# Patient Record
Sex: Male | Born: 1985
Health system: Southern US, Community
[De-identification: ages and names within clinical notes are randomized; demographics above are authoritative.]

## PROBLEM LIST (undated history)

## (undated) DIAGNOSIS — J45909 Unspecified asthma, uncomplicated: Secondary | ICD-10-CM

## (undated) HISTORY — DX: Unspecified asthma, uncomplicated: J45.909

## (undated) HISTORY — PX: EYE SURGERY: SHX253

---
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
## 2004-04-17 ENCOUNTER — Encounter: Admission: RE | Admit: 2004-04-17 | Discharge: 2004-04-17 | Payer: Self-pay | Admitting: Family Medicine

## 2019-06-30 DIAGNOSIS — L309 Dermatitis, unspecified: Secondary | ICD-10-CM | POA: Diagnosis not present

## 2019-07-25 DIAGNOSIS — H44522 Atrophy of globe, left eye: Secondary | ICD-10-CM | POA: Diagnosis not present

## 2019-07-25 DIAGNOSIS — H5211 Myopia, right eye: Secondary | ICD-10-CM | POA: Diagnosis not present

## 2019-08-01 DIAGNOSIS — M792 Neuralgia and neuritis, unspecified: Secondary | ICD-10-CM | POA: Diagnosis not present

## 2019-08-01 DIAGNOSIS — Z970 Presence of artificial eye: Secondary | ICD-10-CM | POA: Diagnosis not present

## 2019-08-01 DIAGNOSIS — K1120 Sialoadenitis, unspecified: Secondary | ICD-10-CM | POA: Diagnosis not present

## 2019-08-03 DIAGNOSIS — N4889 Other specified disorders of penis: Secondary | ICD-10-CM | POA: Diagnosis not present

## 2019-08-03 DIAGNOSIS — N50811 Right testicular pain: Secondary | ICD-10-CM | POA: Diagnosis not present

## 2019-08-03 DIAGNOSIS — R102 Pelvic and perineal pain: Secondary | ICD-10-CM | POA: Diagnosis not present

## 2019-08-03 DIAGNOSIS — N442 Benign cyst of testis: Secondary | ICD-10-CM | POA: Diagnosis not present

## 2019-08-22 DIAGNOSIS — Z0000 Encounter for general adult medical examination without abnormal findings: Secondary | ICD-10-CM | POA: Diagnosis not present

## 2019-08-22 DIAGNOSIS — Z13220 Encounter for screening for lipoid disorders: Secondary | ICD-10-CM | POA: Diagnosis not present

## 2019-08-22 LAB — TSH: TSH: 1.98 (ref 0.41–5.90)

## 2019-08-22 LAB — LIPID PANEL
HDL: 48 (ref 35–70)
LDL Cholesterol: 88
Triglycerides: 41 (ref 40–160)

## 2019-08-29 DIAGNOSIS — K1120 Sialoadenitis, unspecified: Secondary | ICD-10-CM | POA: Diagnosis not present

## 2019-08-29 DIAGNOSIS — M792 Neuralgia and neuritis, unspecified: Secondary | ICD-10-CM | POA: Diagnosis not present

## 2019-08-29 DIAGNOSIS — Z0000 Encounter for general adult medical examination without abnormal findings: Secondary | ICD-10-CM | POA: Diagnosis not present

## 2019-08-29 DIAGNOSIS — Z970 Presence of artificial eye: Secondary | ICD-10-CM | POA: Diagnosis not present

## 2019-10-03 DIAGNOSIS — R102 Pelvic and perineal pain: Secondary | ICD-10-CM | POA: Diagnosis not present

## 2019-10-03 DIAGNOSIS — M6289 Other specified disorders of muscle: Secondary | ICD-10-CM | POA: Diagnosis not present

## 2019-10-03 DIAGNOSIS — M62838 Other muscle spasm: Secondary | ICD-10-CM | POA: Diagnosis not present

## 2019-10-03 DIAGNOSIS — M6281 Muscle weakness (generalized): Secondary | ICD-10-CM | POA: Diagnosis not present

## 2019-10-20 DIAGNOSIS — R102 Pelvic and perineal pain: Secondary | ICD-10-CM | POA: Diagnosis not present

## 2019-10-20 DIAGNOSIS — N4889 Other specified disorders of penis: Secondary | ICD-10-CM | POA: Diagnosis not present

## 2019-10-20 DIAGNOSIS — N442 Benign cyst of testis: Secondary | ICD-10-CM | POA: Diagnosis not present

## 2019-10-20 DIAGNOSIS — N50811 Right testicular pain: Secondary | ICD-10-CM | POA: Diagnosis not present

## 2019-10-31 DIAGNOSIS — N442 Benign cyst of testis: Secondary | ICD-10-CM | POA: Diagnosis not present

## 2019-10-31 DIAGNOSIS — N50811 Right testicular pain: Secondary | ICD-10-CM | POA: Diagnosis not present

## 2019-10-31 DIAGNOSIS — N4889 Other specified disorders of penis: Secondary | ICD-10-CM | POA: Diagnosis not present

## 2019-11-28 DIAGNOSIS — B078 Other viral warts: Secondary | ICD-10-CM | POA: Diagnosis not present

## 2019-12-12 DIAGNOSIS — B078 Other viral warts: Secondary | ICD-10-CM | POA: Diagnosis not present

## 2019-12-12 DIAGNOSIS — L309 Dermatitis, unspecified: Secondary | ICD-10-CM | POA: Diagnosis not present

## 2019-12-29 DIAGNOSIS — B078 Other viral warts: Secondary | ICD-10-CM | POA: Diagnosis not present

## 2019-12-29 DIAGNOSIS — D485 Neoplasm of uncertain behavior of skin: Secondary | ICD-10-CM | POA: Diagnosis not present

## 2020-02-09 DIAGNOSIS — B078 Other viral warts: Secondary | ICD-10-CM | POA: Diagnosis not present

## 2020-03-15 DIAGNOSIS — B078 Other viral warts: Secondary | ICD-10-CM | POA: Diagnosis not present

## 2020-03-15 DIAGNOSIS — D239 Other benign neoplasm of skin, unspecified: Secondary | ICD-10-CM | POA: Diagnosis not present

## 2020-03-30 DIAGNOSIS — J019 Acute sinusitis, unspecified: Secondary | ICD-10-CM | POA: Diagnosis not present

## 2020-04-03 DIAGNOSIS — B078 Other viral warts: Secondary | ICD-10-CM | POA: Diagnosis not present

## 2020-06-19 DIAGNOSIS — R070 Pain in throat: Secondary | ICD-10-CM | POA: Diagnosis not present

## 2020-06-19 DIAGNOSIS — J343 Hypertrophy of nasal turbinates: Secondary | ICD-10-CM | POA: Diagnosis not present

## 2020-08-01 ENCOUNTER — Other Ambulatory Visit: Payer: Self-pay

## 2020-08-01 ENCOUNTER — Ambulatory Visit (INDEPENDENT_AMBULATORY_CARE_PROVIDER_SITE_OTHER): Payer: BC Managed Care – PPO | Admitting: Internal Medicine

## 2020-08-01 ENCOUNTER — Encounter (INDEPENDENT_AMBULATORY_CARE_PROVIDER_SITE_OTHER): Payer: Self-pay

## 2020-08-01 ENCOUNTER — Encounter: Payer: Self-pay | Admitting: Internal Medicine

## 2020-08-01 VITALS — BP 114/72 | HR 75 | Temp 98.0°F | Ht 70.0 in | Wt 167.0 lb

## 2020-08-01 DIAGNOSIS — Z0000 Encounter for general adult medical examination without abnormal findings: Secondary | ICD-10-CM | POA: Diagnosis not present

## 2020-08-01 NOTE — Progress Notes (Signed)
Subjective:  Patient ID: Robert Ellis, male    DOB: Feb 16, 1986  Age: 35 y.o. MRN: [id number]  CC: Annual Exam  This visit occurred during the SARS-CoV-2 public health emergency.  Safety protocols were in place, including screening questions prior to the visit, additional usage of staff PPE, and extensive cleaning of exam room while observing appropriate contact time as indicated for disinfecting solutions.    HPI Robert Ellis presents for establishing.  He wants to establish with a new PCP.  He has a history of right-sided nasal congestion and is followed by ENT.  He has a history of right inguinal and groin discomfort for 10 years and is followed by urology.  He has a history of a horseshoe kidney.  He has a remote history of asthma and tells me he rarely uses albuterol.  He denies any recent episodes of cough, wheezing, or shortness of breath. He had labs done elsewhere about a year ago.   History Robert Ellis has a past medical history of Asthma.   He has a past surgical history that includes Eye surgery (Left).   His family history includes Alcohol abuse in his mother; Cancer in his paternal grandfather; Healthy in his father; Heart disease in his mother; Stroke in his mother.He reports that he has quit smoking. He has never used smokeless tobacco. He reports current alcohol use of about 2.0 standard drinks of alcohol per week. He reports that he does not use drugs.  No outpatient medications prior to visit.   No facility-administered medications prior to visit.    ROS Review of Systems  Constitutional: Negative.   HENT: Positive for congestion. Negative for sore throat and trouble swallowing.   Eyes: Negative.   Respiratory: Negative for cough, chest tightness, shortness of breath and wheezing.   Cardiovascular: Negative.  Negative for chest pain, palpitations and leg swelling.  Gastrointestinal: Negative for abdominal pain, constipation, diarrhea and nausea.  Genitourinary:  Negative.  Negative for difficulty urinating.  Musculoskeletal: Negative.   Skin: Negative.  Negative for rash.  Neurological: Negative.  Negative for dizziness, weakness and light-headedness.  Hematological: Negative for adenopathy. Does not bruise/bleed easily.  Psychiatric/Behavioral: Negative.     Objective:  BP 114/72   Pulse 75   Temp 98 F (36.7 C) (Oral)   Ht 5\' 10"  (1.778 m)   Wt 167 lb (75.8 kg)   SpO2 98%   BMI 23.96 kg/m   Physical Exam Vitals reviewed.  Constitutional:      Appearance: Normal appearance.  HENT:     Nose: Nose normal.     Mouth/Throat:     Mouth: Mucous membranes are moist.  Eyes:     General: No scleral icterus.    Conjunctiva/sclera: Conjunctivae normal.  Cardiovascular:     Rate and Rhythm: Normal rate and regular rhythm.     Heart sounds: No murmur heard.   Pulmonary:     Effort: Pulmonary effort is normal.     Breath sounds: No stridor. No wheezing, rhonchi or rales.  Abdominal:     General: Abdomen is flat. Bowel sounds are normal.     Palpations: Abdomen is soft.     Tenderness: There is no abdominal tenderness.  Musculoskeletal:        General: Normal range of motion.     Cervical back: Neck supple.     Right lower leg: No edema.     Left lower leg: No edema.  Lymphadenopathy:     Cervical: No cervical adenopathy.  Skin:    General: Skin is warm and dry.  Neurological:     General: No focal deficit present.     Mental Status: He is alert.  Psychiatric:        Mood and Affect: Mood normal.        Behavior: Behavior normal.     Lab Results  Component Value Date   TRIG 41 08/22/2019   HDL 48 08/22/2019   LDLCALC 88 08/22/2019   TSH 1.98 08/22/2019    Assessment & Plan:   August was seen today for annual exam.  Diagnoses and all orders for this visit:  Routine general medical examination at a health care facility-exam completed, recent labs reviewed, he deferred on receiving a tetanus booster, patient education  material was given.   Robert Ellis does not currently have medications on file.  No orders of the defined types were placed in this encounter.    Follow-up: No follow-ups on file.  Sanda Linger, MD

## 2020-08-04 ENCOUNTER — Encounter: Payer: Self-pay | Admitting: Internal Medicine

## 2020-08-04 DIAGNOSIS — Z0000 Encounter for general adult medical examination without abnormal findings: Secondary | ICD-10-CM | POA: Insufficient documentation

## 2020-08-04 NOTE — Patient Instructions (Signed)

## 2020-08-13 DIAGNOSIS — H44522 Atrophy of globe, left eye: Secondary | ICD-10-CM | POA: Diagnosis not present

## 2020-08-13 DIAGNOSIS — H5211 Myopia, right eye: Secondary | ICD-10-CM | POA: Diagnosis not present

## 2020-09-19 ENCOUNTER — Encounter: Payer: Self-pay | Admitting: Internal Medicine

## 2020-09-19 ENCOUNTER — Other Ambulatory Visit: Payer: Self-pay

## 2020-09-19 ENCOUNTER — Ambulatory Visit: Payer: BC Managed Care – PPO | Admitting: Internal Medicine

## 2020-09-19 VITALS — BP 112/62 | HR 74 | Temp 98.4°F | Ht 70.0 in | Wt 168.6 lb

## 2020-09-19 DIAGNOSIS — B653 Cercarial dermatitis: Secondary | ICD-10-CM | POA: Diagnosis not present

## 2020-09-19 MED ORDER — TRIAMCINOLONE ACETONIDE 0.5 % EX CREA: 1.0000 "application " | TOPICAL_CREAM | Freq: Three times a day (TID) | CUTANEOUS | 1 refills | Status: AC

## 2020-09-19 NOTE — Progress Notes (Signed)
Subjective:  Patient ID: Robert Ellis, male    DOB: [birth year]  Age: 35 y.o. MRN: [id number]  CC: Rash  This visit occurred during the SARS-CoV-2 public health emergency.  Safety protocols were in place, including screening questions prior to the visit, additional usage of staff PPE, and extensive cleaning of exam room while observing appropriate contact time as indicated for disinfecting solutions.    HPI Robert Ellis presents for a rash - He complains of a 1 week history of mildly pruritic rash in his groin.  His daughter has the same rash.  The rash started while he was at the beach and they both got in the water.  He has not treated the rash and says that it is spontaneously getting better.  No outpatient medications prior to visit.   No facility-administered medications prior to visit.    ROS Review of Systems  Constitutional:  Negative for chills, fatigue and fever.  HENT: Negative.    Eyes: Negative.   Respiratory:  Negative for cough and shortness of breath.   Cardiovascular:  Negative for chest pain, palpitations and leg swelling.  Gastrointestinal:  Negative for abdominal pain, diarrhea and nausea.  Endocrine: Negative.   Genitourinary: Negative.  Negative for difficulty urinating.  Musculoskeletal: Negative.  Negative for myalgias.  Skin:  Positive for rash. Negative for color change.  Neurological: Negative.  Negative for weakness.  Hematological:  Negative for adenopathy. Does not bruise/bleed easily.  Psychiatric/Behavioral: Negative.     Objective:  BP 112/62 (BP Location: Right Arm, Patient Position: Sitting, Cuff Size: Large)   Pulse 74   Temp 98.4 F (36.9 C) (Oral)   Ht 5\' 10"  (1.778 m)   Wt 168 lb 9.6 oz (76.5 kg)   SpO2 96%   BMI 24.19 kg/m   BP Readings from Last 3 Encounters:  09/19/20 112/62  08/01/20 114/72    Wt Readings from Last 3 Encounters:  09/19/20 168 lb 9.6 oz (76.5 kg)  08/01/20 167 lb (75.8 kg)    Physical Exam Vitals reviewed.   Constitutional:      Appearance: Normal appearance.  HENT:     Nose: Nose normal.     Mouth/Throat:     Mouth: Mucous membranes are moist.  Eyes:     Conjunctiva/sclera: Conjunctivae normal.  Cardiovascular:     Rate and Rhythm: Normal rate and regular rhythm.  Pulmonary:     Effort: Pulmonary effort is normal. No respiratory distress.     Breath sounds: No wheezing, rhonchi or rales.  Abdominal:     General: Abdomen is flat.     Palpations: There is no mass.     Tenderness: There is no abdominal tenderness.  Musculoskeletal:        General: Normal range of motion.     Cervical back: Neck supple.  Skin:    Findings: Erythema and rash present. Rash is papular. Rash is not crusting, macular, nodular, purpuric, pustular, scaling, urticarial or vesicular.     Comments: In his upper thigh and groin there are scattered areas of erythematous papules.    Lab Results  Component Value Date   TRIG 41 08/22/2019   HDL 48 08/22/2019   LDLCALC 88 08/22/2019   TSH 1.98 08/22/2019    10/22/2019 Scrotum  Result Date: 04/17/2004 Clinical Data: Right testicular pain. ULTRASOUND OF THE SCROTUM: Scans over the scrotum were performed. The testicles are normal in size and in echogenicity. There is blood flow to both testicles. The epididymis appears normal bilaterally.  Only a small amount of fluid is noted bilaterally. No varicocele is seen.  IMPRESSION: No intratesticular abnormality. There is blood flow to both testicles.  Provider: Molly Maduro   Assessment & Plan:   Avontae was seen today for rash.  Diagnoses and all orders for this visit:  Swimmer's itch- He was given patient information about this.  This is a self-limited illness.  If the pruritus and rash continue to bother him I recommended that he use a low potency topical steroid. -     triamcinolone cream (KENALOG) 0.5 %; Apply 1 application topically 3 (three) times daily.  I am having [name] start on triamcinolone cream.  Meds  ordered this encounter  Medications   triamcinolone cream (KENALOG) 0.5 %    Sig: Apply 1 application topically 3 (three) times daily.    Dispense:  30 g    Refill:  1     Follow-up: Return if symptoms worsen or fail to improve.  Sanda Linger, MD

## 2020-09-19 NOTE — Patient Instructions (Signed)
Swimmer's Itch Swimmer's itch is an itchy rash that can happen after swimming or wading in water. This condition happens most often after contact with fresh water, but it may also occur after contact with salt water. It cannot spread from person to person (is not contagious). The rash usually lasts for about one week. What are the causes? This condition is caused by an allergic reaction to one of several microscopic parasites. These parasites normally infect animals that live by the water, such as ducks, geese, and muskrats. The eggs of the parasite get into the water from stool (feces) of the infected animal. If a human makes accidental contact with the parasite in the water, the parasite enters human skin. It then dies and causes swimmer's itch, which is an allergic reaction to the parasite. What increases the risk? You are more likely to develop this condition if you swim or wade in fresh water that is:  Warm.  Shallow. What are the signs or symptoms? Symptoms of this condition include:  Itching, tingling, or a burning feeling shortly after coming out of the water. Sometimes, the itching is severe.  Red bumps. These occur most often on skin that is not covered by a bathing suit. The bumps can form small blisters.   How is this diagnosed? This condition is diagnosed with a medical history and physical exam. How is this treated? This condition is treated with home care. Treatment may also include medicines such as:  Antihistamines. These may be taken by mouth (orally) to reduce allergic response.  Corticosteroid creams. These reduce inflammation and swelling. Follow these instructions at home: Bathing  Avoid hot showers or baths, which can make itching worse. A cold shower may help with itching.  Try taking a bath with: ? Epsom salts. Follow the instructions on the packaging. You can get these at your local pharmacy or grocery store. ? Baking soda. Pour a small amount into the bath as  directed by your health care provider. ? Colloidal oatmeal. Follow the instructions on the packaging. You can get this at your local pharmacy or grocery store. General instructions  Take or apply over-the-counter and prescription medicines only as told by your health care provider. These may include: ? Applying corticosteroid cream. ? Taking antihistamines.  Apply cool compresses to the affected areas to help with itching.  Do not scratch your skin.  Pay attention to any changes in your condition.  Check your rash every day for signs of infection. Check for: ? Redness, swelling, or pain. ? Fluid or blood. ? Pus or a bad smell.  Keep all follow-up visits as told by your health care provider. This is important. How is this prevented?  Take a shower right after you get out of the water and dry yourself with a towel.  Do not swim or wade in bodies of water where swimmer's itch is a known problem.  Do not feed birds near places where people swim.  Wear protective swimwear, such as a rash guard, while swimming.  Avoid wading or swimming in or near [place] areas. Contact a health care provider if:  Your rash gets worse.  You develop signs of infection, such as redness, tenderness, or pus that comes from the rash. Summary  Swimmer's itch is caused by a parasite that is usually found in fresh water.  It causes itchy bumps on skin that is not covered by a bathing suit.  It can be prevented by avoiding contaminated water and wearing protective swimwear. This  information is not intended to replace advice given to you by your health care provider. Make sure you discuss any questions you have with your health care provider. Document Revised: 11/25/2017 Document Reviewed: 11/25/2017 Elsevier Patient Education  2021 [employer].

## 2021-02-04 ENCOUNTER — Encounter: Payer: BC Managed Care – PPO | Admitting: Internal Medicine

## 2021-03-15 ENCOUNTER — Telehealth: Payer: Self-pay | Admitting: Internal Medicine

## 2021-03-15 NOTE — Telephone Encounter (Signed)
Patient requesting order for labs prior to cpe on 04-23-2021

## 2021-03-16 ENCOUNTER — Other Ambulatory Visit: Payer: Self-pay | Admitting: Internal Medicine

## 2021-03-16 DIAGNOSIS — Z1159 Encounter for screening for other viral diseases: Secondary | ICD-10-CM | POA: Insufficient documentation

## 2021-03-16 DIAGNOSIS — Z0000 Encounter for general adult medical examination without abnormal findings: Secondary | ICD-10-CM

## 2021-03-18 NOTE — Telephone Encounter (Signed)
Called pt, LVM stating lab work was ordered.

## 2021-03-19 ENCOUNTER — Ambulatory Visit: Payer: BC Managed Care – PPO | Admitting: Internal Medicine

## 2021-04-03 ENCOUNTER — Encounter: Payer: Self-pay | Admitting: Internal Medicine

## 2021-04-03 ENCOUNTER — Ambulatory Visit: Payer: BC Managed Care – PPO | Admitting: Internal Medicine

## 2021-04-03 ENCOUNTER — Ambulatory Visit (INDEPENDENT_AMBULATORY_CARE_PROVIDER_SITE_OTHER): Payer: BC Managed Care – PPO

## 2021-04-03 ENCOUNTER — Other Ambulatory Visit: Payer: Self-pay

## 2021-04-03 VITALS — BP 122/78 | HR 60 | Resp 18 | Ht 70.0 in | Wt 169.4 lb

## 2021-04-03 DIAGNOSIS — G8929 Other chronic pain: Secondary | ICD-10-CM

## 2021-04-03 DIAGNOSIS — M545 Low back pain, unspecified: Secondary | ICD-10-CM

## 2021-04-03 DIAGNOSIS — Z0000 Encounter for general adult medical examination without abnormal findings: Secondary | ICD-10-CM | POA: Diagnosis not present

## 2021-04-03 DIAGNOSIS — Z1159 Encounter for screening for other viral diseases: Secondary | ICD-10-CM

## 2021-04-03 LAB — COMPREHENSIVE METABOLIC PANEL
ALT: 16 U/L (ref 0–53)
AST: 21 U/L (ref 0–37)
Albumin: 4.6 g/dL (ref 3.5–5.2)
Alkaline Phosphatase: 51 U/L (ref 39–117)
BUN: 15 mg/dL (ref 6–23)
CO2: 30 mEq/L (ref 19–32)
Calcium: 9.6 mg/dL (ref 8.4–10.5)
Chloride: 102 mEq/L (ref 96–112)
Creatinine, Ser: 1.14 mg/dL (ref 0.40–1.50)
GFR: 82.94 mL/min (ref >60.00)
Glucose, Bld: 90 mg/dL (ref 70–99)
Potassium: 3.8 mEq/L (ref 3.5–5.1)
Sodium: 139 mEq/L (ref 135–145)
Total Bilirubin: 0.4 mg/dL (ref 0.2–1.2)
Total Protein: 8 g/dL (ref 6.0–8.3)

## 2021-04-03 LAB — CBC
HCT: 43.6 % (ref 39.0–52.0)
Hemoglobin: 14.2 g/dL (ref 13.0–17.0)
MCHC: 32.6 g/dL (ref 30.0–36.0)
MCV: 85.1 fl (ref 78.0–100.0)
Platelets: 260 10*3/uL (ref 150.0–400.0)
RBC: 5.12 Mil/uL (ref 4.22–5.81)
RDW: 13 % (ref 11.5–15.5)
WBC: 7 10*3/uL (ref 4.0–10.5)

## 2021-04-03 LAB — LIPID PANEL
Cholesterol: 170 mg/dL (ref 0–200)
HDL: 49.9 mg/dL (ref >39.00)
LDL Cholesterol: 111 mg/dL — ABNORMAL HIGH (ref 0–99)
NonHDL: 120.24
Total CHOL/HDL Ratio: 3
Triglycerides: 45 mg/dL (ref 0.0–149.0)
VLDL: 9 mg/dL (ref 0.0–40.0)

## 2021-04-03 NOTE — Patient Instructions (Signed)
We will do the x-ray today 

## 2021-04-03 NOTE — Assessment & Plan Note (Signed)
Ordered x-ray lumbar to assess and CBC as well. Advised NSAID for 1-2 weeks for pain if needed.

## 2021-04-03 NOTE — Progress Notes (Signed)
° °  Subjective:   Patient ID: Robert Ellis, male    DOB: 05-31-1985, 35 y.o.   MRN: [id number]  Back Pain Pertinent negatives include no abdominal pain or chest pain.  The patient is a 35 YO man coming in for back pain since October.   Review of Systems  Constitutional: Negative.   HENT: Negative.    Eyes: Negative.   Respiratory:  Negative for cough, chest tightness and shortness of breath.   Cardiovascular:  Negative for chest pain, palpitations and leg swelling.  Gastrointestinal:  Negative for abdominal distention, abdominal pain, constipation, diarrhea, nausea and vomiting.  Musculoskeletal:  Positive for back pain.  Skin: Negative.   Neurological: Negative.   Psychiatric/Behavioral: Negative.     Objective:  Physical Exam Constitutional:      Appearance: He is well-developed.  HENT:     Head: Normocephalic and atraumatic.  Cardiovascular:     Rate and Rhythm: Normal rate and regular rhythm.  Pulmonary:     Effort: Pulmonary effort is normal. No respiratory distress.     Breath sounds: Normal breath sounds. No wheezing or rales.  Abdominal:     General: Bowel sounds are normal. There is no distension.     Palpations: Abdomen is soft.     Tenderness: There is no abdominal tenderness. There is no rebound.  Musculoskeletal:        General: Tenderness present.     Cervical back: Normal range of motion.     Comments: Around L1-2 some spinal tenderness  Skin:    General: Skin is warm and dry.  Neurological:     Mental Status: He is alert and oriented to person, place, and time.     Coordination: Coordination normal.    Vitals:   04/03/21 1024  BP: 122/78  Pulse: 60  Resp: 18  SpO2: 99%  Weight: 169 lb 6.4 oz (76.8 kg)  Height: 5\' 10"  (1.778 m)    This visit occurred during the SARS-CoV-2 public health emergency.  Safety protocols were in place, including screening questions prior to the visit, additional usage of staff PPE, and extensive cleaning of exam room  while observing appropriate contact time as indicated for disinfecting solutions.   Assessment & Plan:

## 2021-04-04 LAB — HIV ANTIBODY (ROUTINE TESTING W REFLEX): HIV 1&2 Ab, 4th Generation: NONREACTIVE

## 2021-04-04 LAB — HEPATITIS C ANTIBODY
Hepatitis C Ab: NONREACTIVE
SIGNAL TO CUT-OFF: 0.12 (ref <1.00)

## 2021-04-23 ENCOUNTER — Encounter: Payer: BC Managed Care – PPO | Admitting: Internal Medicine

## 2021-04-23 ENCOUNTER — Ambulatory Visit: Payer: BC Managed Care – PPO | Admitting: Internal Medicine

## 2021-05-08 ENCOUNTER — Ambulatory Visit (INDEPENDENT_AMBULATORY_CARE_PROVIDER_SITE_OTHER): Payer: BC Managed Care – PPO | Admitting: Internal Medicine

## 2021-05-08 ENCOUNTER — Encounter: Payer: Self-pay | Admitting: Internal Medicine

## 2021-05-08 ENCOUNTER — Other Ambulatory Visit: Payer: Self-pay

## 2021-05-08 VITALS — BP 122/74 | HR 80 | Temp 97.8°F | Resp 16 | Ht 70.0 in | Wt 166.0 lb

## 2021-05-08 DIAGNOSIS — Z0000 Encounter for general adult medical examination without abnormal findings: Secondary | ICD-10-CM | POA: Diagnosis not present

## 2021-05-08 NOTE — Progress Notes (Signed)
Subjective:  Patient ID: Robert Ellis, male    DOB: 28-Mar-1986  Age: 36 y.o. MRN: [id number]  CC: Annual Exam  This visit occurred during the SARS-CoV-2 public health emergency.  Safety protocols were in place, including screening questions prior to the visit, additional usage of staff PPE, and extensive cleaning of exam room while observing appropriate contact time as indicated for disinfecting solutions.    HPI Robert Ellis presents for a CPX.   He feels well.  Offers no complaints.  Outpatient Medications Prior to Visit  Medication Sig Dispense Refill   triamcinolone cream (KENALOG) 0.5 % Apply 1 application topically 3 (three) times daily. 30 g 1   No facility-administered medications prior to visit.    ROS Review of Systems  Constitutional: Negative.   HENT: Negative.    Eyes: Negative.   Respiratory:  Negative for cough and shortness of breath.   Cardiovascular:  Negative for chest pain, palpitations and leg swelling.  Gastrointestinal:  Negative for abdominal pain, diarrhea and nausea.  Endocrine: Negative.   Genitourinary: Negative.  Negative for dysuria, penile swelling, scrotal swelling and testicular pain.  Musculoskeletal: Negative.   Skin: Negative.   Neurological: Negative.   Hematological:  Negative for adenopathy. Does not bruise/bleed easily.  Psychiatric/Behavioral: Negative.     Objective:  BP 122/74 (BP Location: Left Arm, Patient Position: Sitting, Cuff Size: Normal)    Pulse 80    Temp 97.8 F (36.6 C) (Oral)    Resp 16    Ht 5\' 10"  (1.778 m)    Wt 166 lb (75.3 kg)    SpO2 97%    BMI 23.82 kg/m   BP Readings from Last 3 Encounters:  05/08/21 122/74  04/03/21 122/78  09/19/20 112/62    Wt Readings from Last 3 Encounters:  05/08/21 166 lb (75.3 kg)  04/03/21 169 lb 6.4 oz (76.8 kg)  09/19/20 168 lb 9.6 oz (76.5 kg)    Physical Exam Vitals reviewed.  HENT:     Nose: Nose normal.     Mouth/Throat:     Mouth: Mucous membranes are moist.   Eyes:     General: No scleral icterus.    Conjunctiva/sclera: Conjunctivae normal.  Cardiovascular:     Rate and Rhythm: Normal rate and regular rhythm.     Heart sounds: No murmur heard. Pulmonary:     Effort: Pulmonary effort is normal.     Breath sounds: No stridor. No wheezing, rhonchi or rales.  Abdominal:     General: Abdomen is flat.     Palpations: There is no mass.     Tenderness: There is no abdominal tenderness. There is no guarding.     Hernia: No hernia is present. There is no hernia in the left inguinal area or right inguinal area.  Genitourinary:    Pubic Area: No rash.      Penis: Normal and circumcised.      Testes: Normal.        Right: Mass, tenderness or swelling not present.        Left: Mass, tenderness or swelling not present.     Epididymis:     Right: Normal.     Left: Normal.  Musculoskeletal:        General: Normal range of motion.     Cervical back: Neck supple.  Lymphadenopathy:     Cervical: No cervical adenopathy.     Lower Body: No right inguinal adenopathy. No left inguinal adenopathy.  Skin:    General:  Skin is warm and dry.  Neurological:     General: No focal deficit present.     Mental Status: He is alert.  Psychiatric:        Mood and Affect: Mood normal.        Behavior: Behavior normal.    Lab Results  Component Value Date   WBC 7.0 04/03/2021   HGB 14.2 04/03/2021   HCT 43.6 04/03/2021   PLT 260.0 04/03/2021   GLUCOSE 90 04/03/2021   CHOL 170 04/03/2021   TRIG 45.0 04/03/2021   HDL 49.90 04/03/2021   LDLCALC 111 (H) 04/03/2021   ALT 16 04/03/2021   AST 21 04/03/2021   NA 139 04/03/2021   K 3.8 04/03/2021   CL 102 04/03/2021   CREATININE 1.14 04/03/2021   BUN 15 04/03/2021   CO2 30 04/03/2021   TSH 1.98 08/22/2019    US Scrotum  Result Date: 04/17/2004 Clinical Data: Right testicular pain. ULTRASOUND OF THE SCROTUM: Scans over the scrotum were performed. The testicles are normal in size and in echogenicity. There  is blood flow to both testicles. The epididymis appears normal bilaterally. Only a small amount of fluid is noted bilaterally. No varicocele is seen.  IMPRESSION: No intratesticular abnormality. There is blood flow to both testicles.  Provider: Molly Maduro   Assessment & Plan:   Robert Ellis was seen today for annual exam.  Diagnoses and all orders for this visit:  Routine general medical examination at a health care facility- Exam completed, labs reviewed, vaccines reviewed; I have asked him to inform me about tetanus and influenza vaccines, no cancer screenings indicated, patient education was given.   I am having Robert Ellis maintain his triamcinolone cream.  No orders of the defined types were placed in this encounter.    Follow-up: Return if symptoms worsen or fail to improve.  Sanda Linger, MD

## 2021-05-08 NOTE — Patient Instructions (Signed)

## 2021-08-09 ENCOUNTER — Ambulatory Visit: Payer: BC Managed Care – PPO | Admitting: Internal Medicine

## 2021-08-09 ENCOUNTER — Encounter: Payer: Self-pay | Admitting: Internal Medicine

## 2021-08-09 VITALS — BP 112/62 | HR 71 | Resp 18 | Ht 70.0 in | Wt 164.6 lb

## 2021-08-09 DIAGNOSIS — R002 Palpitations: Secondary | ICD-10-CM | POA: Insufficient documentation

## 2021-08-09 NOTE — Progress Notes (Signed)
? ?  Subjective:  ? ?Patient ID: Robert Ellis, male    DOB: 03-09-1986, 36 y.o.   MRN: [id number] ? ?HPI ?The patient is a 36 YO man coming in for palpitations. More caffeine intake and stress during that time. ? ?Review of Systems  ?Constitutional: Negative.   ?HENT: Negative.    ?Eyes: Negative.   ?Respiratory:  Negative for cough, chest tightness and shortness of breath.   ?Cardiovascular:  Positive for palpitations. Negative for chest pain and leg swelling.  ?Gastrointestinal:  Negative for abdominal distention, abdominal pain, constipation, diarrhea, nausea and vomiting.  ?Musculoskeletal: Negative.   ?Skin: Negative.   ?Neurological: Negative.   ?Psychiatric/Behavioral: Negative.    ? ?Objective:  ?Physical Exam ?Constitutional:   ?   Appearance: He is well-developed.  ?HENT:  ?   Head: Normocephalic and atraumatic.  ?Cardiovascular:  ?   Rate and Rhythm: Normal rate and regular rhythm.  ?Pulmonary:  ?   Effort: Pulmonary effort is normal. No respiratory distress.  ?   Breath sounds: Normal breath sounds. No wheezing or rales.  ?Abdominal:  ?   General: Bowel sounds are normal. There is no distension.  ?   Palpations: Abdomen is soft.  ?   Tenderness: There is no abdominal tenderness. There is no rebound.  ?Musculoskeletal:  ?   Cervical back: Normal range of motion.  ?Skin: ?   General: Skin is warm and dry.  ?Neurological:  ?   Mental Status: He is alert and oriented to person, place, and time.  ?   Coordination: Coordination normal.  ? ? ?Vitals:  ? 08/09/21 0843  ?BP: 112/62  ?Pulse: 71  ?Resp: 18  ?SpO2: 98%  ?Weight: 164 lb 9.6 oz (74.7 kg)  ?Height: 5\' 10"  (1.778 m)  ? ? ?EKG: Rate 66, axis normal, interval normal, sinus, no st or t wave changes, no prior to compare ? ?This visit occurred during the SARS-CoV-2 public health emergency.  Safety protocols were in place, including screening questions prior to the visit, additional usage of staff PPE, and extensive cleaning of exam room while observing  appropriate contact time as indicated for disinfecting solutions.  ? ?Assessment & Plan:  ? ?

## 2021-08-09 NOTE — Assessment & Plan Note (Signed)
EKG done at office consistent with normal sinus. Likely PVCs. These were in the setting of increased caffeine and have reduced in frequency since resuming normal levels of caffeine. Advised if not resolving in next 1-2 weeks we can do holter monitor for confirmation. Labs reviewed from December normal and no change in health since that time. No exercise restrictions. ?

## 2021-08-09 NOTE — Patient Instructions (Signed)
Watch caffeine intake to avoid these heart racing and if more often let us know we can do the heart monitor.  ?

## 2021-11-27 DIAGNOSIS — R102 Pelvic and perineal pain: Secondary | ICD-10-CM | POA: Diagnosis not present

## 2021-11-27 DIAGNOSIS — N442 Benign cyst of testis: Secondary | ICD-10-CM | POA: Diagnosis not present

## 2022-02-12 ENCOUNTER — Other Ambulatory Visit: Payer: Self-pay | Admitting: Internal Medicine

## 2022-02-12 ENCOUNTER — Telehealth: Payer: Self-pay | Admitting: Internal Medicine

## 2022-02-12 NOTE — Telephone Encounter (Signed)
Pt called requesting documentation of his blood type for appointment next week with wife's OB. They request he provide documentation of his blood type. He will come pick it up when ready. Pt phone [phone]

## 2022-02-13 ENCOUNTER — Other Ambulatory Visit: Payer: BC Managed Care – PPO

## 2022-02-13 ENCOUNTER — Other Ambulatory Visit: Payer: Self-pay | Admitting: Internal Medicine

## 2022-02-13 DIAGNOSIS — Z0000 Encounter for general adult medical examination without abnormal findings: Secondary | ICD-10-CM

## 2022-02-13 NOTE — Telephone Encounter (Signed)
LVM stating that lab was ordered and he can come by to have it drawn when he is available.

## 2022-02-13 NOTE — Telephone Encounter (Signed)
Patient would like to proceed with lab work. He would like a call back to let him know where and when he can get the lab drawn. [phone]

## 2022-02-13 NOTE — Telephone Encounter (Signed)
LVM stating that lab could be ordered for blood time. If he would like to proceed give the office a call back.

## 2022-02-14 ENCOUNTER — Encounter: Payer: Self-pay | Admitting: Internal Medicine

## 2022-02-14 LAB — ABO

## 2022-02-25 ENCOUNTER — Ambulatory Visit (INDEPENDENT_AMBULATORY_CARE_PROVIDER_SITE_OTHER): Payer: BC Managed Care – PPO | Admitting: Sports Medicine

## 2022-02-25 ENCOUNTER — Ambulatory Visit: Payer: Self-pay

## 2022-02-25 VITALS — BP 110/60 | Ht 70.0 in | Wt 165.0 lb

## 2022-02-25 DIAGNOSIS — M25562 Pain in left knee: Secondary | ICD-10-CM | POA: Diagnosis not present

## 2022-02-25 NOTE — Assessment & Plan Note (Signed)
While he does appear to have meniscal injury, I think conservative care is reasonable. Compression Ice Modify activity for 1 month to focus on stationary biking Modify future lifting to avoid deep knee flexion with weights

## 2022-02-25 NOTE — Progress Notes (Signed)
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, broadcasting/film/video) He did a deep squat with his knees fully flexed touching his calves with his thighs When he stood up he felt a sharp pop on the left lateral knee This has been painful since that time so he has not been running He has done some swimming He generally stays physically active and includes a variety of activities  6 years ago he was told he had a knee injury and on x-ray they question could he have some early arthritis However since that time he is not really had any recurrent knee issues  Review of systems No locking No giving way No visible swelling No nighttime pain  Physical exam Physically fit white male in no acute distress BP 110/60   Ht 5\' 10"  (1.778 m)   Wt 165 lb (74.8 kg)   BMI 23.68 kg/m   Knee: Left Normal to inspection with no erythema or effusion or obvious bony abnormalities. Palpation normal with no warmth or joint line tenderness or patellar tenderness or condyle tenderness. ROM normal in flexion and extension and lower leg rotation. Ligaments with solid consistent endpoints including ACL, PCL, LCL, MCL. Negative Mcmurray's and provocative meniscal tests except mild pain noted laterally with rotation and with palpation of the fibular head Non painful patellar compression. Patellar and quadriceps tendons unremarkable. Hamstring and quadriceps strength is normal.  Ultrasound of left knee Suprapatellar pouch does show an effusion Patellar and quadriceps tendons look normal Medial meniscus is unremarkable Lateral meniscus shows a degenerative split ITB is normal but some fluid as it crosses tibia Gastroc and BF look normal with some calcification in soft tissues at fibular head  Impression: probably partial split of lateral meniscus with effusion  Ultrasound and interpretation by . Sibyl Parr, MD

## 2022-04-17 ENCOUNTER — Ambulatory Visit (INDEPENDENT_AMBULATORY_CARE_PROVIDER_SITE_OTHER): Payer: BC Managed Care – PPO | Admitting: Sports Medicine

## 2022-04-17 ENCOUNTER — Ambulatory Visit
Admission: RE | Admit: 2022-04-17 | Discharge: 2022-04-17 | Disposition: A | Discharge status: Home / Self Care | Payer: BC Managed Care – PPO | Source: Ambulatory Visit | Attending: Sports Medicine | Admitting: Sports Medicine

## 2022-04-17 VITALS — BP 110/78 | Ht 70.0 in | Wt 165.0 lb

## 2022-04-17 DIAGNOSIS — M25462 Effusion, left knee: Secondary | ICD-10-CM | POA: Diagnosis not present

## 2022-04-17 DIAGNOSIS — M25562 Pain in left knee: Secondary | ICD-10-CM | POA: Diagnosis not present

## 2022-04-17 NOTE — Progress Notes (Addendum)
   Subjective:    Patient ID: Robert Ellis, male    DOB: Mar 29, 1986, 37 y.o.   MRN: [id number]  HPI  Jeet presents today with persistent left knee pain.  He was last seen in the office in November.  Dr. Oneida Alar performed an ultrasound at that time which showed a possible split tear of the lateral meniscus with an effusion.  He was treated with compression and ice at that time.  Also activity modification on a stationary bike.  Unfortunately, his pain has not resolved.  He describes pain along the lateral knee as well as tightness with flexion.  He is also began to notice atrophy of his left quad.  Activities such as step ups and step downs do cause pain.  His history is significant for intermittent locking and catching of his knee which has been occurring for several years.  He describes a sensation of the knee locking which requires him to maneuver the knee in order to regain mobility.  6 years ago while living in [place], he had an MRI which was unremarkable but he continued to have these episodes approximately once a year up until October of this year.  At that time he had another occurrence but his pain has not resolved.   Review of Systems As above    Objective:   Physical Exam  Well-developed, fit appearing.  No acute distress  Left knee: Range of motion is 0 to approximately 130 degrees.  Trace effusion.  He is tender to palpation along the lateral joint line with a positive McMurray's.  Positive Thessaly's.  No tenderness along the medial joint line.  Knee is stable to anterior and posterior drawer testing.  Knee is stable to medial and lateral collateral ligament stressing.  Neurovascularly intact distally.  Left quadriceps measures 47.5 cm at the midportion.  Right quadriceps measures 48.5 cm at the midportion.      Assessment & Plan:   Chronic left knee pain worrisome for meniscus tear versus loose body  I believe Elmo's quad atrophy is in fact due to his ongoing knee pain.  His  exam today suggest possible lateral meniscal tear which was also appreciated on previous ultrasound.  I recommended an x-ray and an MRI to evaluate further.  Phone follow-up with those results when available.  In the meantime, he will continue with compression and I have recommended that he avoid all close chain lower body exercises in favor of open chain exercises.  We will give him isometric quad exercises to start doing daily.  He may continue with swimming and biking using pain as his guide.   This note was dictated using Dragon naturally speaking software and may contain errors in syntax, spelling, or content which have not been identified prior to signing this note.   Addendum: X-ray reviewed no osseous abnormality and no significant degenerative changes.  There is a joint effusion.  Proceed with MRI as scheduled.

## 2022-04-25 ENCOUNTER — Ambulatory Visit: Payer: BC Managed Care – PPO | Admitting: Family Medicine

## 2022-05-08 ENCOUNTER — Ambulatory Visit
Admission: RE | Admit: 2022-05-08 | Discharge: 2022-05-08 | Disposition: A | Discharge status: Home / Self Care | Payer: BC Managed Care – PPO | Source: Ambulatory Visit | Attending: Sports Medicine | Admitting: Sports Medicine

## 2022-05-08 DIAGNOSIS — M25562 Pain in left knee: Secondary | ICD-10-CM | POA: Diagnosis not present

## 2022-05-12 ENCOUNTER — Encounter: Payer: Self-pay | Admitting: Sports Medicine

## 2022-05-13 ENCOUNTER — Other Ambulatory Visit: Payer: Self-pay

## 2022-05-13 DIAGNOSIS — M25562 Pain in left knee: Secondary | ICD-10-CM

## 2022-05-15 DIAGNOSIS — Z0000 Encounter for general adult medical examination without abnormal findings: Secondary | ICD-10-CM | POA: Diagnosis not present

## 2022-05-22 ENCOUNTER — Other Ambulatory Visit: Payer: Self-pay

## 2022-05-22 ENCOUNTER — Ambulatory Visit: Payer: BC Managed Care – PPO | Attending: Sports Medicine

## 2022-05-22 DIAGNOSIS — G8929 Other chronic pain: Secondary | ICD-10-CM | POA: Insufficient documentation

## 2022-05-22 DIAGNOSIS — M6281 Muscle weakness (generalized): Secondary | ICD-10-CM | POA: Insufficient documentation

## 2022-05-22 DIAGNOSIS — M25562 Pain in left knee: Secondary | ICD-10-CM | POA: Diagnosis not present

## 2022-05-22 NOTE — Therapy (Signed)
OUTPATIENT PHYSICAL THERAPY LOWER EXTREMITY EVALUATION   Patient Name: Robert Ellis MRN: [id number] DOB:09/16/1985, 37 y.o., male Today's Date: 05/23/2022  END OF SESSION:  PT End of Session - 05/23/22 1526     Visit Number 1    Number of Visits 9    Date for PT Re-Evaluation 07/25/22    Authorization Type BCBS COMM PPO    PT Start Time 1630    PT Stop Time [id number]    PT Time Calculation (min) 45 min    Activity Tolerance Patient tolerated treatment well    Behavior During Therapy [facility] for tasks assessed/performed             Past Medical History:  Diagnosis Date   Asthma    Past Surgical History:  Procedure Laterality Date   EYE SURGERY Left    Patient Active Problem List   Diagnosis Date Noted   Left lateral knee pain 02/25/2022   Palpitations 08/09/2021   Routine general medical examination at a health care facility 08/04/2020    PCP: Janith Lima, MD  REFERRING PROVIDER: Thurman Coyer, DO   REFERRING DIAG: [phone] (ICD-10-CM) - Acute pain of left knee   THERAPY DIAG:  Chronic pain of left knee  Muscle weakness (generalized)  Rationale for Evaluation and Treatment: Rehabilitation  ONSET DATE: 5 months ago  SUBJECTIVE:   SUBJECTIVE STATEMENT: Pt reports 5 months ago injured his L knee. It was determined to be a lateral menicus tear. He notes the pain was primarily lateral, but is now the pain is primarily of the quad tendon/patellar junction. He also feels that his L knee is weaker and he does experience an occasional click. He has come to PT to rehab his L knee so he can be more active and to play with his young children without L knee pain. Previously, he was running a couple of miles 2x/week and working out 3-4x/week including CKC exs. Now he is exercising by swimming and mountain biking which do not aggravate his L knee. He reports he can do a fully pistol squat with his R leg, while his L is only able squat a few inches, being limited by pain and  weakness.  PERTINENT HISTORY: Asthma PAIN:  Are you having pain? Yes: NPRS scale: 1/10 Pain location: l knee  Pain description: ache Aggravating factors: testing by mini squats, running Relieving factors: Ice regularly 3/10 with working out  PRECAUTIONS: None  WEIGHT BEARING RESTRICTIONS: No  FALLS:  Has patient fallen in last 6 months? No  LIVING ENVIRONMENT: Lives with: lives with their family Lives in: House/apartment  OCCUPATION: Desk type work  PLOF: Independent  PATIENT GOALS: To return to working out with his legs and to play with his young children without L knee pain   OBJECTIVE:  MRI- L knee  05/09/2022  DIAGNOSTIC FINDINGS:   IMPRESSION: 1. Radial tear of the lateral meniscus posterior body. 2. Early tricompartmental osteoarthritis. 3. Small Baker cyst.   PATIENT SURVEYS:  FOTO: Perceived function   61%, predicted   79%   COGNITION: Overall cognitive status: Within functional limits for tasks assessed     SENSATION: WFL  EDEMA:  None observed or palpated  MUSCLE LENGTH: Hamstrings: Right WNLs deg; Left WNLs deg Marcello Moores test: Right WNLs deg; Left WNLs deg  POSTURE: No Significant postural limitations  PALPATION: TTP quad tendon/patellar junction, lateral joint line. Taut muscle bands of the L vastus lateralis and medius  LOWER EXTREMITY ROM: Grossly WNLs Active ROM Right  eval Left eval  Hip flexion    Hip extension    Hip abduction    Hip adduction    Hip internal rotation    Hip external rotation    Knee flexion    Knee extension    Ankle dorsiflexion    Ankle plantarflexion    Ankle inversion    Ankle eversion     (Blank rows = not tested)  LOWER EXTREMITY MMT:  MMT Right eval Left eval  Hip flexion 5, 39psi 5, 39psi  Hip extension 5 4+  Hip abduction 5 4+  Hip adduction 5 5  Hip internal rotation 5 5  Hip external rotation 5 4+  Knee flexion 43 psi 42 psi  Knee extension 53 psi 31 psi pain provoked  Ankle  dorsiflexion    Ankle plantarflexion    Ankle inversion    Ankle eversion     (Blank rows = not tested)  LOWER EXTREMITY SPECIAL TESTS:  Knee special tests: McMurray's test: negative, Thessaly test: negative, Patellafemoral apprehension test: negative, and Patellafemoral grind test: negative  FUNCTIONAL TESTS:  NT   GAIT: Distance walked: 200' Assistive device utilized: None Level of assistance: Complete Independence Comments: No issues for the distance walked   TODAY'S TREATMENT:                                                                                                                               [facility] Adult PT Treatment:                                                DATE: 09/20/22 Therapeutic Exercise: Developed, instructed in, and pt completed therex as noted in HEP  Self Care: Use of cold packs for symptom management. Try knee ext wt machine and or inclined body weight leg press at gym for 10 reps 2-3 sets for strengthening. If aggravates L knee, then discontinue.    PATIENT EDUCATION:  Education details: Eval findings, POC, HEP, self care  Person educated: Patient Education method: Explanation, Demonstration, Tactile cues, Verbal cues, and Handouts Education comprehension: verbalized understanding, returned demonstration, verbal cues required, and tactile cues required  HOME EXERCISE PROGRAM: Access Code: [email or web address] URL: [email or web address] Date: 05/22/2022 Prepared by: Gar Ponto  Exercises - Sidelying Hip Abduction  - 1 x daily - 7 x weekly - 3 sets - 10 reps - 3 hold - Active Straight Leg Raise with Quad Set  - 1 x daily - 7 x weekly - 3 sets - 10 reps - 3 hold - Clamshell with Resistance (Mirrored)  - 1 x daily - 7 x weekly - 3 sets - 10 reps - 3 hold - Sit to Stand Without Arm Support  - 1 x daily - 7 x weekly - 3 sets - 10 reps - 3 hold  ASSESSMENT:  CLINICAL IMPRESSION: Patient is a 37 y.o. male who was seen today for physical therapy  evaluation and treatment for M25.562 (ICD-10-CM) - Acute pain of left knee. Pt experienced an initial L knee injury 5 months ago with a MRI revealing a radial tear of the lateral meniscus posterior body and early tricompartmental osteoarthritis. Pain associated with this injury has mostly resolved, and now pt is experiencing ant. L knee (quad tendon/patellar junction) pain and weakness with CKC activities. Dynamometer testing revealed the L knee ext at 58 % strength of the R. Pt will benefit from skilled PT to address L knee impairments for improved function with less pain.   OBJECTIVE IMPAIRMENTS: decreased activity tolerance, decreased strength, increased muscle spasms, and pain.   ACTIVITY LIMITATIONS: lifting, bending, and locomotion level  PARTICIPATION LIMITATIONS:  Physical activity, working out in [place]: Past/current experiences and Time since onset of injury/illness/exacerbation are also affecting patient's functional outcome.   REHAB POTENTIAL: Excellent  CLINICAL DECISION MAKING: Stable/uncomplicated  EVALUATION COMPLEXITY: Low   GOALS:  SHORT TERM GOALS: Target date: 06/13/22 Pt will be Ind in an initial HEP Baseline: initiated Goal status: INITIAL  2.  Pt will voice understanding of measures to assist in pain reduction  Baseline: Initiated Goal status: INITIAL  LONG TERM GOALS: Target date: 07/25/22  Pt will be Ind in a final HEP to maintain achieved LOF  Baseline: initiated Goal status: INITIAL  2.  Increase L knee ext strength to 90% of the R, 48 psi, for improved function with less pain  Baseline: 31psi Goal status: INITIAL  3.  Increase L hip strength to 5/5 for improved function of the L LE Baseline: see flow sheets Goal status: INITIAL  4.  Pt will be able to complete a L LE pistol squat to 45d s pain for improved function of the L LE  Baseline: 10d Goal status: INITIAL  5.  Pt will return to running 2 miles 2x a week with and increase in  pain not exceeding 2/10 Baseline: unable Goal status: INITIAL   PLAN:  PT FREQUENCY: 1x/week  PT DURATION: 8 weeks  PLANNED INTERVENTIONS: Therapeutic exercises, Therapeutic activity, Gait training, Patient/Family education, Self Care, Joint mobilization, Stair training, Aquatic Therapy, Electrical stimulation, Cryotherapy, Moist heat, Taping, Vasopneumatic device, Ultrasound, Ionotophoresis 17m/ml Dexamethasone, Manual therapy, and Re-evaluation  PLAN FOR NEXT SESSION: Review FOTO; assess response to HEP; progress therex as indicated; use of modalities, manual therapy; and TPDN as indicated.  Hattye Siegfried MS, PT 05/23/22 4:32 PM

## 2022-06-10 ENCOUNTER — Ambulatory Visit: Payer: BC Managed Care – PPO

## 2022-06-10 DIAGNOSIS — M6281 Muscle weakness (generalized): Secondary | ICD-10-CM

## 2022-06-10 DIAGNOSIS — M25562 Pain in left knee: Secondary | ICD-10-CM | POA: Diagnosis not present

## 2022-06-10 DIAGNOSIS — G8929 Other chronic pain: Secondary | ICD-10-CM | POA: Diagnosis not present

## 2022-06-10 NOTE — Therapy (Addendum)
OUTPATIENT PHYSICAL THERAPY TREATMENT NOTE/Discharge   Patient Name: Robert Ellis MRN: [id number] DOB:[birth year], 37 y.o., male Today's Date: 06/10/2022  PCP: [name], MD   REFERRING PROVIDER: Ralene Cork, DO   END OF SESSION:   PT End of Session - 06/10/22 1145     Visit Number 2    Number of Visits 9    Date for PT Re-Evaluation 07/25/22    Authorization Type BCBS COMM PPO    PT Start Time 1145    PT Stop Time 1230    PT Time Calculation (min) 45 min    Activity Tolerance Patient tolerated treatment well    Behavior During Therapy WFL for tasks assessed/performed             Past Medical History:  Diagnosis Date   Asthma    Past Surgical History:  Procedure Laterality Date   EYE SURGERY Left    Patient Active Problem List   Diagnosis Date Noted   Left lateral knee pain 02/25/2022   Palpitations 08/09/2021   Routine general medical examination at a health care facility 08/04/2020    REFERRING DIAG: M25.562 (ICD-10-CM) - Acute pain of left knee   THERAPY DIAG:  Chronic pain of left knee  Muscle weakness (generalized)  Rationale for Evaluation and Treatment Rehabilitation   OBJECTIVE: (objective measures completed at initial evaluation unless otherwise dated)   ONSET DATE: 5 months ago   SUBJECTIVE:    SUBJECTIVE STATEMENT: Pt reports he is improving with his L knee pain and strength. He is now able to go up/down steps with a reciprocal pattern and is progressing his activities/exercises at the gym.   PERTINENT HISTORY: Asthma PAIN:  Are you having pain? Yes: NPRS scale: /10 Pain location: l knee  Pain description: ache Aggravating factors: testing by mini squats, running) Relieving factors: Ice regularly 3/10 with working out   PRECAUTIONS: None   WEIGHT BEARING RESTRICTIONS: No   FALLS:  Has patient fallen in last 6 months? No   LIVING ENVIRONMENT: Lives with: lives with their family Lives in: House/apartment    OCCUPATION: Desk type work   PLOF: Independent   PATIENT GOALS: To return to working out with his legs and to play with his young children without L knee pain    OBJECTIVE:  MRI- L knee  05/09/2022   DIAGNOSTIC FINDINGS:   IMPRESSION: 1. Radial tear of the lateral meniscus posterior body. 2. Early tricompartmental osteoarthritis. 3. Small Baker cyst.   PATIENT SURVEYS:  FOTO: Perceived function   61%, predicted   79%    COGNITION: Overall cognitive status: Within functional limits for tasks assessed                         SENSATION: WFL   EDEMA:  None observed or palpated   MUSCLE LENGTH: Hamstrings: Right WNLs deg; Left WNLs deg Maisie Fus test: Right WNLs deg; Left WNLs deg   POSTURE: No Significant postural limitations   PALPATION: TTP quad tendon/patellar junction, lateral joint line. Taut muscle bands of the L vastus lateralis and medius   LOWER EXTREMITY ROM: Grossly WNLs Active ROM Right eval Left eval  Hip flexion      Hip extension      Hip abduction      Hip adduction      Hip internal rotation      Hip external rotation      Knee flexion      Knee extension  Ankle dorsiflexion      Ankle plantarflexion      Ankle inversion      Ankle eversion       (Blank rows = not tested)   LOWER EXTREMITY MMT:   MMT Right eval Left eval  Hip flexion 5, 39psi 5, 39psi  Hip extension 5 4+  Hip abduction 5 4+  Hip adduction 5 5  Hip internal rotation 5 5  Hip external rotation 5 4+  Knee flexion 43 psi 42 psi  Knee extension 53 psi 31 psi pain provoked  Ankle dorsiflexion      Ankle plantarflexion      Ankle inversion      Ankle eversion       (Blank rows = not tested)   LOWER EXTREMITY SPECIAL TESTS:  Knee special tests: McMurray's test: negative, Thessaly test: negative, Patellafemoral apprehension test: negative, and Patellafemoral grind test: negative   FUNCTIONAL TESTS:  NT    GAIT: Distance walked: 200' Assistive device utilized:  None Level of assistance: Complete Independence Comments: No issues for the distance walked     TODAY'S TREATMENT: [facility] Adult PT Treatment:                                                DATE: 05/21/22 Therapeutic Exercise: Omega L knee ext 5# x10, B 15# con, L ecc x10 Cybex leg press L 20#, B 60# con, L ecc  Partial lunges x10 Hinged hip squats x12 45# 17" Dead lifts x10 45# 17" Hamstring stretch 2x30 Piriformis stretch 2x30 ITB stretch 2x30 Quad stretch 2x30 Updated HEP                                                                                                                     [facility] Adult PT Treatment:                                                DATE: 05/22/22 Therapeutic Exercise: Developed, instructed in, and pt completed therex as noted in HEP  Self Care: Use of cold packs for symptom management. Try knee ext wt machine and or inclined body weight leg press at gym for 10 reps 2-3 sets for strengthening. If aggravates L knee, then discontinue.     PATIENT EDUCATION:  Education details: Eval findings, POC, HEP, self care  Person educated: Patient Education method: Explanation, Demonstration, Tactile cues, Verbal cues, and Handouts Education comprehension: verbalized understanding, returned demonstration, verbal cues required, and tactile cues required   HOME EXERCISE PROGRAM: Access Code: [email or web address] URL: [email or web address] Date: 06/10/2022 Prepared by: Joellyn Rued  Exercises - Sidelying Hip Abduction  - 1 x daily - 7 x weekly - 3 sets - 10 reps - 3 hold -  Active Straight Leg Raise with Quad Set  - 1 x daily - 7 x weekly - 3 sets - 10 reps - 3 hold - Clamshell with Resistance (Mirrored)  - 1 x daily - 7 x weekly - 3 sets - 10 reps - 3 hold - Sit to Stand Without Arm Support  - 1 x daily - 7 x weekly - 3 sets - 10 reps - 3 hold - Seated Hamstring Stretch  - 1 x daily - 7 x weekly - 1 sets - 3 reps - 30 hold - Seated Piriformis Stretch  - 1 x daily - 7  x weekly - 1 sets - 3 reps - 30 hold - Supine ITB Stretch with Strap  - 1 x daily - 7 x weekly - 1 sets - 3 reps - 30 hold - Prone Quadriceps Stretch with Strap  - 1 x daily - 7 x weekly - 1 sets - 3 reps - 30 hold - Standing Alternating Partial Lunge  - 1 x daily - 7 x weekly - 2-3 sets - 10 reps - Kettlebell Deadlift  - 1 x daily - 7 x weekly - 2-3 sets - 10 reps - Half Deadlift with Kettlebell  - 1 x daily - 7 x weekly - 2-3 sets - 10 reps - Side Stepping with Resistance at Ankles  - 1 x daily - 7 x weekly - 2-3 sets - 10 reps - knee extension and leg press wt machines   ASSESSMENT:   CLINICAL IMPRESSION: Pt returns to PT after being consistent with his HEP and light strengthening exs at the gym. Pt reports his L knee strength and the pain/strain sensation with L his patellar/tendon junctions are improving. Pt was completed today for LE/quad progressive strengthening and flexibility. The L quad is improved, but much weaker than the R. Pt tolerated PT today. Pt will continue to benefit from skilled PT to address impairments for improved L LE/knee function with less pain. Pt's HEP was progressed. Pt is to continue to complete his HEP and gym workouts and return to PT for progression of therapy as indicated.   OBJECTIVE IMPAIRMENTS: decreased activity tolerance, decreased strength, increased muscle spasms, and pain.    ACTIVITY LIMITATIONS: lifting, bending, and locomotion level   PARTICIPATION LIMITATIONS:  Physical activity, working out in [place]   PERSONAL FACTORS: Past/current experiences and Time since onset of injury/illness/exacerbation are also affecting patient's functional outcome.    REHAB POTENTIAL: Excellent   CLINICAL DECISION MAKING: Stable/uncomplicated   EVALUATION COMPLEXITY: Low     GOALS:   SHORT TERM GOALS: Target date: 06/13/22 Pt will be Ind in an initial HEP Baseline: initiated Goal status: INITIAL   2.  Pt will voice understanding of measures to assist in pain  reduction  Baseline: Initiated Goal status: INITIAL   LONG TERM GOALS: Target date: 07/25/22   Pt will be Ind in a final HEP to maintain achieved LOF  Baseline: initiated Goal status: INITIAL   2.  Increase L knee ext strength to 90% of the R, 48 psi, for improved function with less pain  Baseline: 31psi Goal status: INITIAL   3.  Increase L hip strength to 5/5 for improved function of the L LE Baseline: see flow sheets Goal status: INITIAL   4.  Pt will be able to complete a L LE pistol squat to 45d s pain for improved function of the L LE  Baseline: 10d Goal status: INITIAL   5.  Pt will  return to running 2 miles 2x a week with and increase in pain not exceeding 2/10 Baseline: unable Goal status: INITIAL     PLAN:   PT FREQUENCY: 1x/week   PT DURATION: 8 weeks   PLANNED INTERVENTIONS: Therapeutic exercises, Therapeutic activity, Gait training, Patient/Family education, Self Care, Joint mobilization, Stair training, Aquatic Therapy, Electrical stimulation, Cryotherapy, Moist heat, Taping, Vasopneumatic device, Ultrasound, Ionotophoresis 4mg /ml Dexamethasone, Manual therapy, and Re-evaluation   PLAN FOR NEXT SESSION: Review FOTO; assess response to HEP; progress therex as indicated; use of modalities, manual therapy; and TPDN as indicated.   Joellyn Rued, PT 06/10/2022, 1:08 PM  PHYSICAL THERAPY DISCHARGE SUMMARY  Visits from Start of Care: 2  Current functional level related to goals / functional outcomes: Pt did not return to PT   Remaining deficits: Pt did not return to PT   Education / Equipment: HEP   Patient agrees to discharge. Patient goals were  not met . Patient is being discharged due to not returning since the last visit.   Syrah Daughtrey MS, PT 08/27/22 1:25 PM

## 2022-06-13 DIAGNOSIS — R509 Fever, unspecified: Secondary | ICD-10-CM | POA: Diagnosis not present

## 2022-06-13 DIAGNOSIS — B349 Viral infection, unspecified: Secondary | ICD-10-CM | POA: Diagnosis not present

## 2022-06-13 DIAGNOSIS — M791 Myalgia, unspecified site: Secondary | ICD-10-CM | POA: Diagnosis not present

## 2022-06-13 DIAGNOSIS — R059 Cough, unspecified: Secondary | ICD-10-CM | POA: Diagnosis not present

## 2022-06-17 ENCOUNTER — Ambulatory Visit: Payer: BC Managed Care – PPO | Admitting: Physical Therapy

## 2022-06-24 ENCOUNTER — Ambulatory Visit: Payer: BC Managed Care – PPO

## 2022-06-30 ENCOUNTER — Ambulatory Visit: Payer: BC Managed Care – PPO | Admitting: Physical Therapy

## 2022-07-07 ENCOUNTER — Encounter: Payer: BC Managed Care – PPO | Admitting: Physical Therapy

## 2022-07-10 ENCOUNTER — Ambulatory Visit: Payer: BC Managed Care – PPO

## 2022-07-15 ENCOUNTER — Ambulatory Visit: Payer: BC Managed Care – PPO

## 2022-07-28 ENCOUNTER — Ambulatory Visit: Payer: BC Managed Care – PPO | Admitting: Physical Therapy

## 2022-08-05 ENCOUNTER — Ambulatory Visit: Payer: BC Managed Care – PPO

## 2022-08-12 ENCOUNTER — Encounter: Payer: Self-pay | Admitting: Internal Medicine

## 2022-08-12 ENCOUNTER — Ambulatory Visit (INDEPENDENT_AMBULATORY_CARE_PROVIDER_SITE_OTHER): Payer: BC Managed Care – PPO | Admitting: Internal Medicine

## 2022-08-12 VITALS — BP 128/80 | HR 104 | Temp 97.6°F | Ht 70.0 in | Wt 165.0 lb

## 2022-08-12 DIAGNOSIS — Z0000 Encounter for general adult medical examination without abnormal findings: Secondary | ICD-10-CM

## 2022-08-12 DIAGNOSIS — M79674 Pain in right toe(s): Secondary | ICD-10-CM | POA: Insufficient documentation

## 2022-08-12 LAB — CBC
HCT: 41.2 % (ref 39.0–52.0)
Hemoglobin: 13.8 g/dL (ref 13.0–17.0)
MCHC: 33.6 g/dL (ref 30.0–36.0)
MCV: 83.6 fl (ref 78.0–100.0)
Platelets: 282 10*3/uL (ref 150.0–400.0)
RBC: 4.92 Mil/uL (ref 4.22–5.81)
RDW: 12.9 % (ref 11.5–15.5)
WBC: 7.1 10*3/uL (ref 4.0–10.5)

## 2022-08-12 LAB — COMPREHENSIVE METABOLIC PANEL
ALT: 14 U/L (ref 0–53)
AST: 19 U/L (ref 0–37)
Albumin: 4.3 g/dL (ref 3.5–5.2)
Alkaline Phosphatase: 48 U/L (ref 39–117)
BUN: 15 mg/dL (ref 6–23)
CO2: 29 mEq/L (ref 19–32)
Calcium: 9.4 mg/dL (ref 8.4–10.5)
Chloride: 103 mEq/L (ref 96–112)
Creatinine, Ser: 1.21 mg/dL (ref 0.40–1.50)
GFR: 76.48 mL/min (ref >60.00)
Glucose, Bld: 93 mg/dL (ref 70–99)
Potassium: 3.6 mEq/L (ref 3.5–5.1)
Sodium: 139 mEq/L (ref 135–145)
Total Bilirubin: 0.4 mg/dL (ref 0.2–1.2)
Total Protein: 7.3 g/dL (ref 6.0–8.3)

## 2022-08-12 LAB — LIPID PANEL
Cholesterol: 146 mg/dL (ref 0–200)
HDL: 43.4 mg/dL (ref >39.00)
LDL Cholesterol: 79 mg/dL (ref 0–99)
NonHDL: 102.65
Total CHOL/HDL Ratio: 3
Triglycerides: 117 mg/dL (ref 0.0–149.0)
VLDL: 23.4 mg/dL (ref 0.0–40.0)

## 2022-08-12 NOTE — Assessment & Plan Note (Signed)
Suspect related to left knee tendon injury last fall. Pain started shortly afterwards and we discussed change in gait or pressure points. He is still working with PT on left leg. Use voltaren gel on the toe TID for 2 weeks as needed.

## 2022-08-12 NOTE — Progress Notes (Signed)
   Subjective:   Patient ID: Robert Ellis, male    DOB: [birth year], 37 y.o.   MRN: [id number]  Toe Pain    The patient is a 37 YO man coming in for toe discomfort.   Review of Systems  Constitutional: Negative.   HENT: Negative.    Eyes: Negative.   Respiratory:  Negative for cough, chest tightness and shortness of breath.   Cardiovascular:  Negative for chest pain, palpitations and leg swelling.  Gastrointestinal:  Negative for abdominal distention, abdominal pain, constipation, diarrhea, nausea and vomiting.  Musculoskeletal:  Positive for myalgias.  Skin: Negative.   Neurological: Negative.   Psychiatric/Behavioral: Negative.      Objective:  Physical Exam Constitutional:      Appearance: He is well-developed.  HENT:     Head: Normocephalic and atraumatic.  Cardiovascular:     Rate and Rhythm: Normal rate and regular rhythm.  Pulmonary:     Effort: Pulmonary effort is normal. No respiratory distress.     Breath sounds: Normal breath sounds. No wheezing or rales.  Abdominal:     General: Bowel sounds are normal. There is no distension.     Palpations: Abdomen is soft.     Tenderness: There is no abdominal tenderness. There is no rebound.  Musculoskeletal:     Cervical back: Normal range of motion.     Comments: Callusing on the right great toe distal joint without tenderness to palpation or manipulation of the joint  Skin:    General: Skin is warm and dry.  Neurological:     Mental Status: He is alert and oriented to person, place, and time.     Coordination: Coordination normal.     Vitals:   08/12/22 1427  BP: 128/80  Pulse: (!) 104  Temp: 97.6 F (36.4 C)  TempSrc: Oral  SpO2: 99%  Weight: 165 lb (74.8 kg)  Height: 5\' 10"  (1.778 m)    Assessment & Plan:  Visit time 15 minutes in face to face communication with patient and coordination of care, additional 5 minutes spent in record review, coordination or care, ordering tests, communicating/referring to  other healthcare professionals, documenting in medical records all on the same day of the visit for total time 20 minutes spent on the visit.

## 2022-08-12 NOTE — Patient Instructions (Addendum)
Try voltaren gel on the foot 3 times a day

## 2023-04-10 IMAGING — DX DG LUMBAR SPINE COMPLETE 4+V
5 series · 5 of 5 positions shown · non-contrast
Comparison: None.

CLINICAL DATA: Low back pain for 2 months, no known injury, initial
encounter

EXAM:
LUMBAR SPINE - COMPLETE 4+ VIEW

[l-spine obl (1 of 2)]
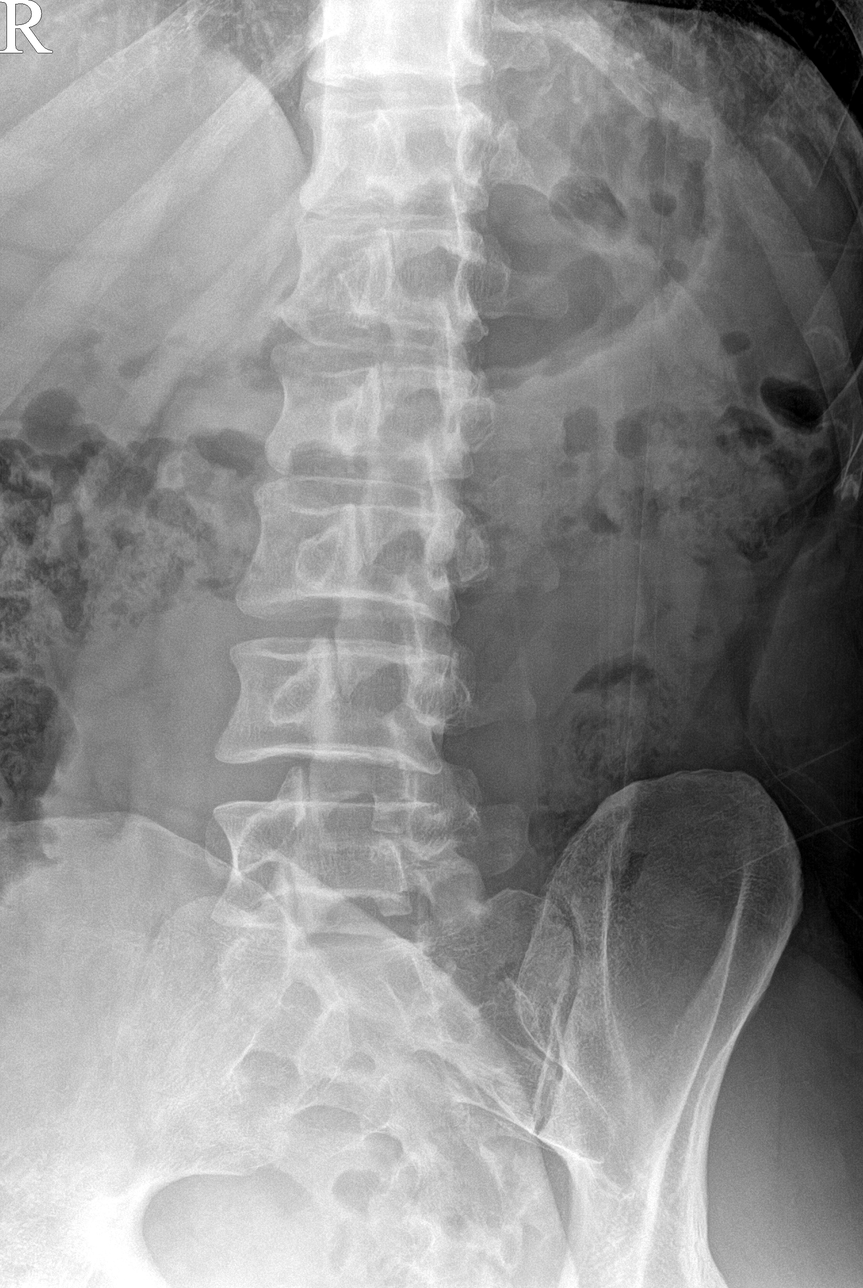

[l-spine obl (2 of 2)]
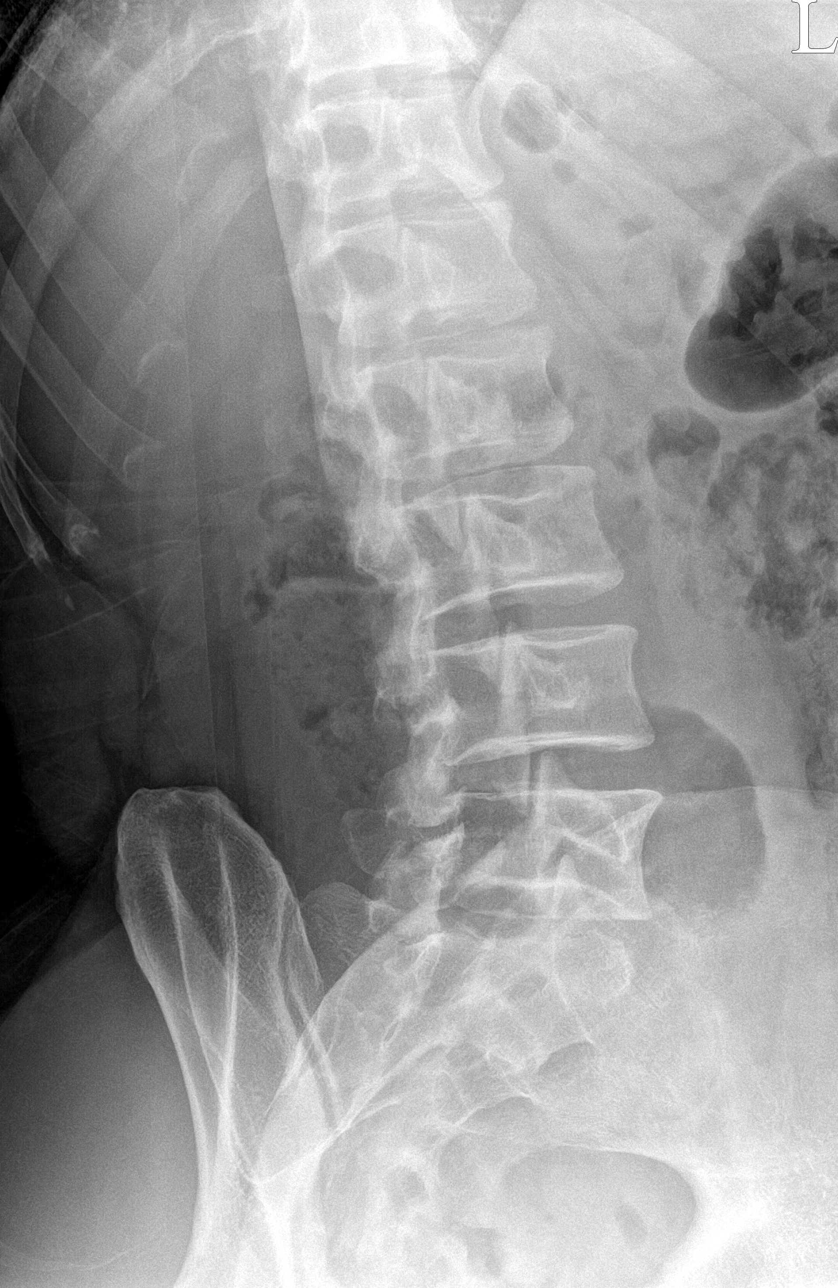

[l-spine lateral]
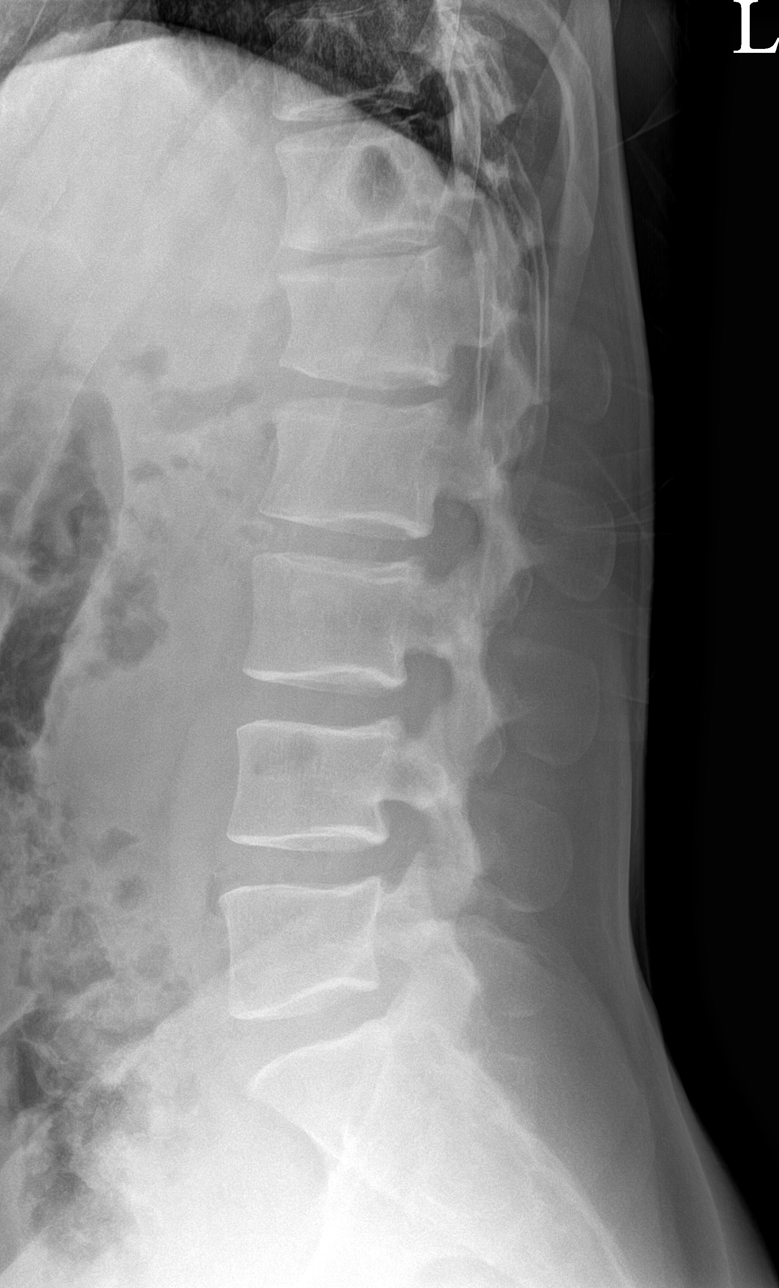

[l-spine spot]
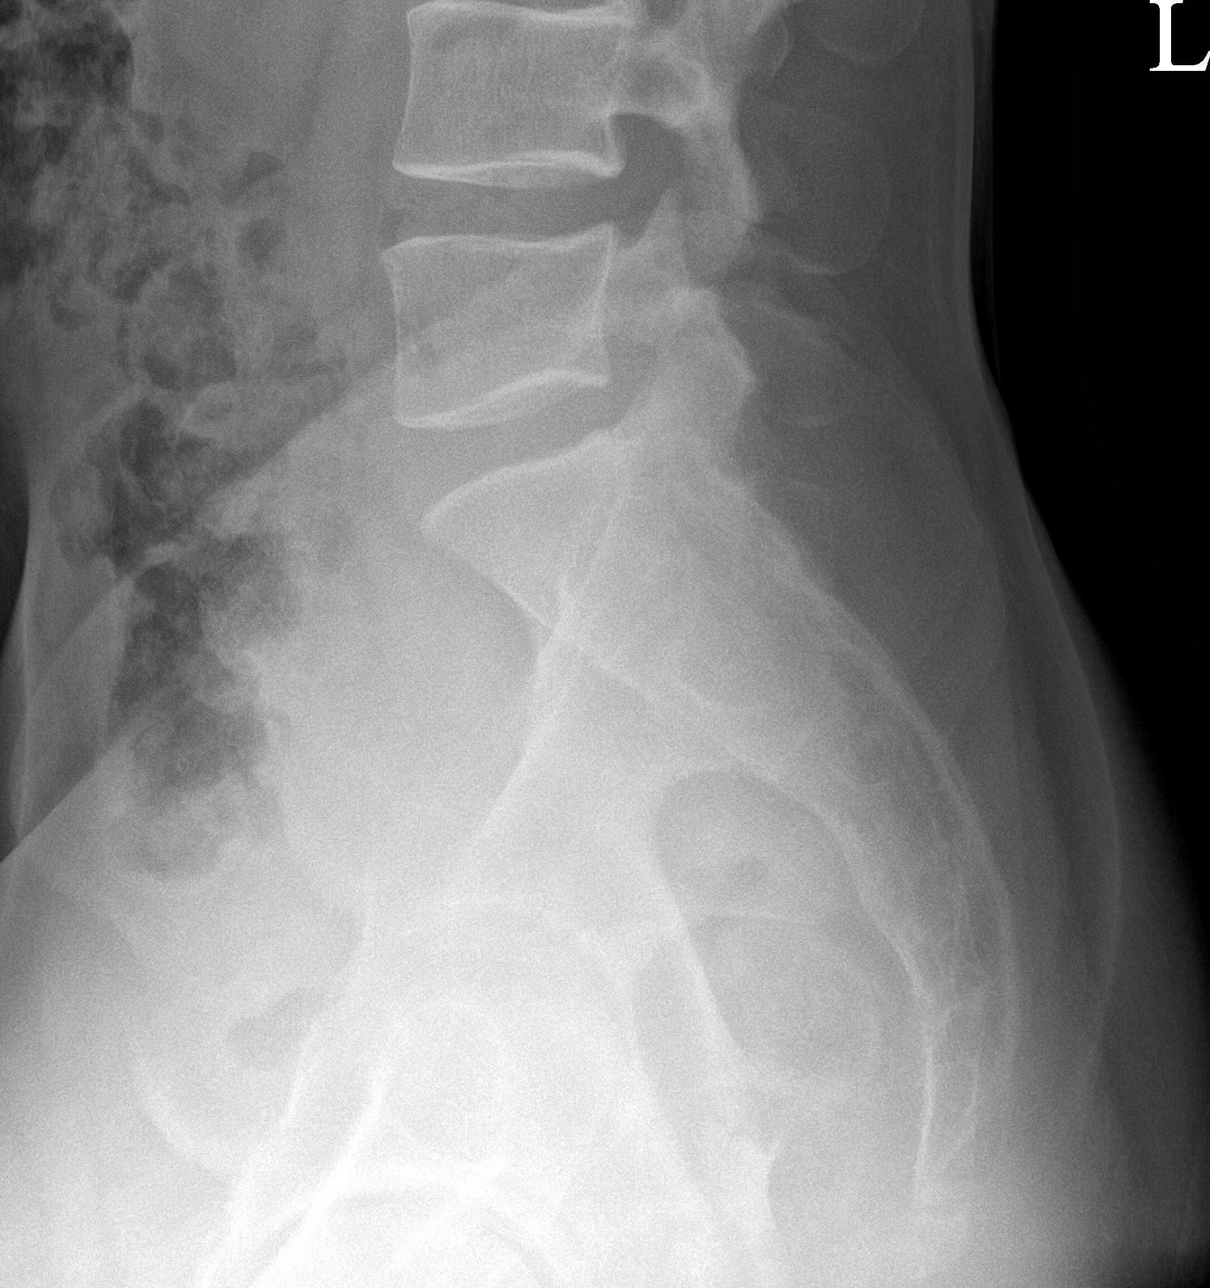

[l-spine ap]
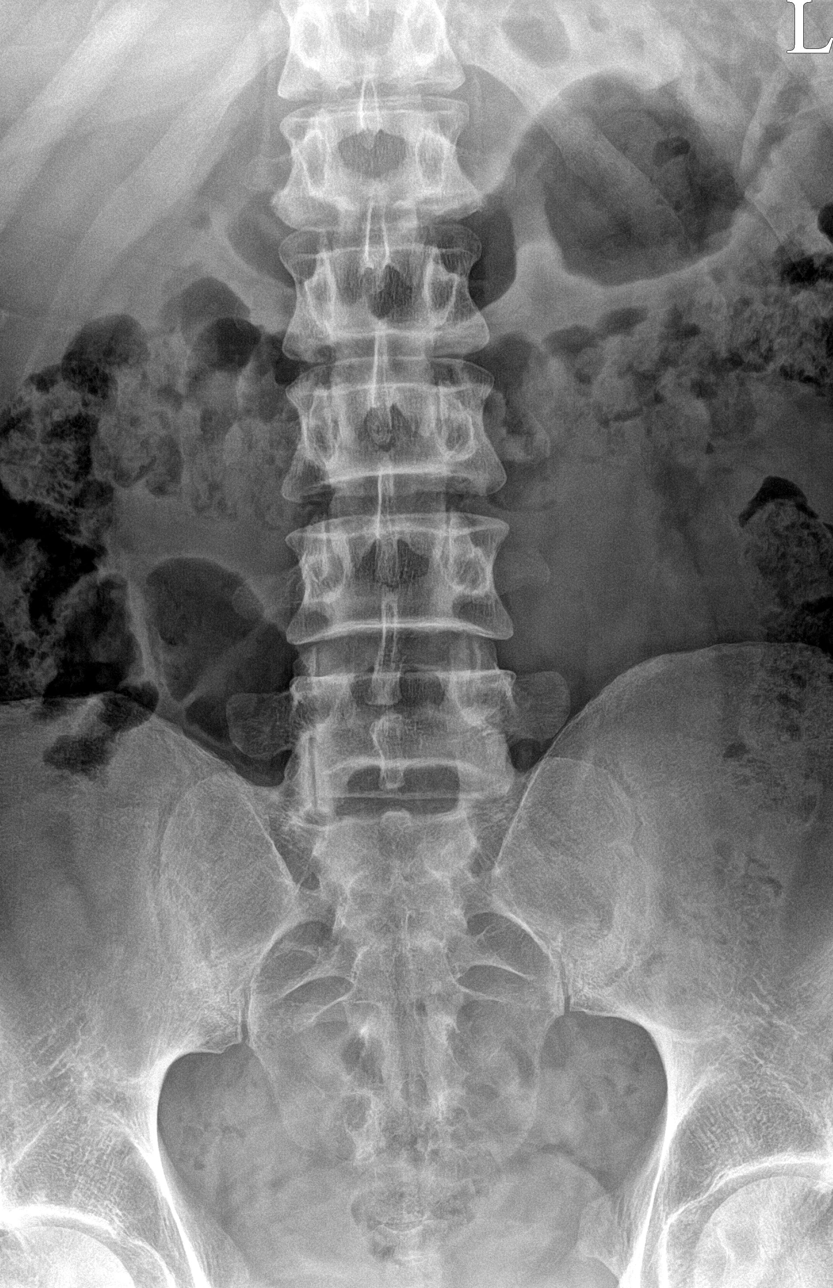

[5 of 5 positions shown; findings below may reference images not displayed]

FINDINGS: Five lumbar type vertebral bodies are well visualized. Vertebral
body height is well maintained. No pars defects are noted. No soft
tissue abnormality is noted.
IMPRESSION: No acute abnormality seen.
